# Patient Record
Sex: Female | Born: 1995 | Race: Black or African American | Hispanic: No | Marital: Single
Health system: Southern US, Community
[De-identification: ages and names within clinical notes are randomized; demographics above are authoritative.]

---

## 2020-05-07 ENCOUNTER — Emergency Department (HOSPITAL_COMMUNITY): Payer: BLUE CROSS/BLUE SHIELD

## 2020-05-07 ENCOUNTER — Emergency Department (HOSPITAL_COMMUNITY)
Admission: EM | Admit: 2020-05-07 | Discharge: 2020-05-07 | Disposition: A | Discharge status: Home / Self Care | Payer: BLUE CROSS/BLUE SHIELD | Attending: Emergency Medicine | Admitting: Emergency Medicine

## 2020-05-07 ENCOUNTER — Other Ambulatory Visit: Payer: Self-pay

## 2020-05-07 ENCOUNTER — Encounter (HOSPITAL_COMMUNITY): Payer: Self-pay

## 2020-05-07 DIAGNOSIS — Z23 Encounter for immunization: Secondary | ICD-10-CM | POA: Insufficient documentation

## 2020-05-07 DIAGNOSIS — T07XXXA Unspecified multiple injuries, initial encounter: Secondary | ICD-10-CM

## 2020-05-07 DIAGNOSIS — S1091XA Abrasion of unspecified part of neck, initial encounter: Secondary | ICD-10-CM | POA: Insufficient documentation

## 2020-05-07 DIAGNOSIS — S0990XA Unspecified injury of head, initial encounter: Secondary | ICD-10-CM | POA: Diagnosis present

## 2020-05-07 DIAGNOSIS — S60511A Abrasion of right hand, initial encounter: Secondary | ICD-10-CM | POA: Insufficient documentation

## 2020-05-07 DIAGNOSIS — S0081XA Abrasion of other part of head, initial encounter: Secondary | ICD-10-CM | POA: Insufficient documentation

## 2020-05-07 DIAGNOSIS — S0083XA Contusion of other part of head, initial encounter: Secondary | ICD-10-CM | POA: Insufficient documentation

## 2020-05-07 DIAGNOSIS — S060X9A Concussion with loss of consciousness of unspecified duration, initial encounter: Secondary | ICD-10-CM | POA: Diagnosis not present

## 2020-05-07 DIAGNOSIS — Y92410 Unspecified street and highway as the place of occurrence of the external cause: Secondary | ICD-10-CM | POA: Insufficient documentation

## 2020-05-07 LAB — BASIC METABOLIC PANEL
Anion gap: 10 (ref 5–15)
BUN: 7 mg/dL (ref 6–20)
CO2: 22 mmol/L (ref 22–32)
Calcium: 9.2 mg/dL (ref 8.9–10.3)
Chloride: 102 mmol/L (ref 98–111)
Creatinine, Ser: 0.79 mg/dL (ref 0.44–1.00)
GFR, Estimated: >60 mL/min (ref >60)
Glucose, Bld: 109 mg/dL — ABNORMAL HIGH (ref 70–99)
Potassium: 3.7 mmol/L (ref 3.5–5.1)
Sodium: 134 mmol/L — ABNORMAL LOW (ref 135–145)

## 2020-05-07 LAB — CBC WITH DIFFERENTIAL/PLATELET
Abs Immature Granulocytes: 0.02 10*3/uL (ref 0.00–0.07)
Basophils Absolute: 0 10*3/uL (ref 0.0–0.1)
Basophils Relative: 1 %
Eosinophils Absolute: 0 10*3/uL (ref 0.0–0.5)
Eosinophils Relative: 1 %
HCT: 37.3 % (ref 36.0–46.0)
Hemoglobin: 11.9 g/dL — ABNORMAL LOW (ref 12.0–15.0)
Immature Granulocytes: 0 %
Lymphocytes Relative: 16 %
Lymphs Abs: 1 10*3/uL (ref 0.7–4.0)
MCH: 29.7 pg (ref 26.0–34.0)
MCHC: 31.9 g/dL (ref 30.0–36.0)
MCV: 93 fL (ref 80.0–100.0)
Monocytes Absolute: 0.6 10*3/uL (ref 0.1–1.0)
Monocytes Relative: 9 %
Neutro Abs: 4.4 10*3/uL (ref 1.7–7.7)
Neutrophils Relative %: 73 %
Platelets: 268 10*3/uL (ref 150–400)
RBC: 4.01 MIL/uL (ref 3.87–5.11)
RDW: 12 % (ref 11.5–15.5)
WBC: 6 10*3/uL (ref 4.0–10.5)
nRBC: 0 % (ref 0.0–0.2)

## 2020-05-07 LAB — I-STAT CHEM 8, ED
BUN: 7 mg/dL (ref 6–20)
Calcium, Ion: 1.15 mmol/L (ref 1.15–1.40)
Chloride: 103 mmol/L (ref 98–111)
Creatinine, Ser: 0.6 mg/dL (ref 0.44–1.00)
Glucose, Bld: 107 mg/dL — ABNORMAL HIGH (ref 70–99)
HCT: 38 % (ref 36.0–46.0)
Hemoglobin: 12.9 g/dL (ref 12.0–15.0)
Potassium: 3.6 mmol/L (ref 3.5–5.1)
Sodium: 137 mmol/L (ref 135–145)
TCO2: 22 mmol/L (ref 22–32)

## 2020-05-07 LAB — I-STAT BETA HCG BLOOD, ED (MC, WL, AP ONLY): I-stat hCG, quantitative: <5 m[IU]/mL (ref <5)

## 2020-05-07 MED ORDER — NAPROXEN 500 MG PO TABS: 500.0000 mg | ORAL_TABLET | Freq: Two times a day (BID) | ORAL | 0 refills | Status: AC

## 2020-05-07 MED ORDER — SODIUM CHLORIDE 0.9 % IV BOLUS
1000.0000 mL | Freq: Once | INTRAVENOUS | Status: AC
  Administered 2020-05-07: 1000 mL via INTRAVENOUS

## 2020-05-07 MED ORDER — METHOCARBAMOL 500 MG PO TABS: 500.0000 mg | ORAL_TABLET | Freq: Two times a day (BID) | ORAL | 0 refills | Status: AC

## 2020-05-07 MED ORDER — TETANUS-DIPHTH-ACELL PERTUSSIS 5-2.5-18.5 LF-MCG/0.5 IM SUSY
0.5000 mL | PREFILLED_SYRINGE | Freq: Once | INTRAMUSCULAR | Status: AC
  Administered 2020-05-07: 0.5 mL via INTRAMUSCULAR
  Filled 2020-05-07: qty 0.5

## 2020-05-07 MED ORDER — IOHEXOL 300 MG/ML  SOLN
100.0000 mL | Freq: Once | INTRAMUSCULAR | Status: AC | PRN
  Administered 2020-05-07: 100 mL via INTRAVENOUS
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
## 2020-05-07 NOTE — ED Notes (Signed)
Pt is back in the bathroom with the door locked & not answering staff.
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
## 2020-05-07 NOTE — ED Notes (Signed)
Pt in the bathroom, door locked not responding to staff.

## 2020-05-07 NOTE — ED Notes (Signed)
Help get patient into a gown on the monitor patient is resting with call bell in reach and nurse and doctor at bedside
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
## 2020-05-07 NOTE — ED Notes (Signed)
Pt still in restroom, spoke to staff from bathroom, not opening door for staff.

## 2020-05-07 NOTE — ED Notes (Signed)
Dressing applied to pt's neck, paper scrubs given to pt so she can change & IV removed. Pt was walked over to an empty room in purple to wait for her ride to arrive & pick her up.

## 2020-05-07 NOTE — Progress Notes (Signed)
   05/07/20 1600  Clinical Encounter Type  Visited With Patient not available  Visit Type Trauma  Referral From Nurse  Consult/Referral To Chaplain  The chaplain responded to level 2 page. No family present. No chaplain services needed. The patient is stable.

## 2020-05-07 NOTE — ED Notes (Signed)
Pt opened the bathroom door & has returned to her room.
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
